# Patient Record
Sex: Male | Born: 1980 | Race: White | Hispanic: Yes | Marital: Single | Smoking: Never smoker
Health system: Southern US, Community
[De-identification: ages and names within clinical notes are randomized; demographics above are authoritative.]

## PROBLEM LIST (undated history)

## (undated) HISTORY — PX: NO PAST SURGERIES: SHX2092

---

## 2017-10-22 ENCOUNTER — Other Ambulatory Visit: Payer: Self-pay

## 2017-10-22 ENCOUNTER — Ambulatory Visit (INDEPENDENT_AMBULATORY_CARE_PROVIDER_SITE_OTHER): Payer: Self-pay

## 2017-10-22 ENCOUNTER — Ambulatory Visit
Admission: EM | Admit: 2017-10-22 | Discharge: 2017-10-22 | Disposition: A | Discharge status: Home / Self Care | Payer: Self-pay | Attending: Family Medicine | Admitting: Family Medicine

## 2017-10-22 DIAGNOSIS — R3129 Other microscopic hematuria: Secondary | ICD-10-CM

## 2017-10-22 DIAGNOSIS — R059 Cough, unspecified: Secondary | ICD-10-CM

## 2017-10-22 DIAGNOSIS — R05 Cough: Secondary | ICD-10-CM

## 2017-10-22 LAB — URINALYSIS, COMPLETE (UACMP) WITH MICROSCOPIC
BILIRUBIN URINE: NEGATIVE
Bacteria, UA: NONE SEEN
GLUCOSE, UA: NEGATIVE mg/dL
KETONES UR: NEGATIVE mg/dL
LEUKOCYTES UA: NEGATIVE
NITRITE: NEGATIVE
PH: 6.5 (ref 5.0–8.0)
Protein, ur: NEGATIVE mg/dL
Specific Gravity, Urine: 1.02 (ref 1.005–1.030)
Squamous Epithelial / LPF: NONE SEEN (ref 0–5)
WBC, UA: NONE SEEN WBC/hpf (ref 0–5)

## 2017-10-22 MED ORDER — TAMSULOSIN HCL 0.4 MG PO CAPS: 0.4000 mg | ORAL_CAPSULE | Freq: Every day | ORAL | 0 refills | Status: AC

## 2017-10-22 MED ORDER — BENZONATATE 100 MG PO CAPS: 100.0000 mg | ORAL_CAPSULE | Freq: Three times a day (TID) | ORAL | 0 refills | Status: AC | PRN

## 2017-10-22 NOTE — ED Triage Notes (Signed)
Also c/o dysuria, chest pain, headache.

## 2017-10-22 NOTE — ED Provider Notes (Signed)
[facility]    CSN: [id number] Arrival date & time: 10/22/17  1751  History   Chief Complaint Chief Complaint  Patient presents with  . Fever   HPI  37 year old male presents with multiple complaints.  Patient is Spanish-speaking.  Interpreter used today: [id number].   Patient reports a 2-week history of subjective fever.  He has felt hot.  He has not taken his temperature.  He is afebrile here.  He reports ongoing cough and associated chest discomfort.  Productive cough.  Additionally, patient reports pain with urination.  He is also had left low back pain/flank pain.  No hematuria.  No known exacerbating relieving factors.  No other associated symptoms.  No other concerns at this time.  History reviewed. No pertinent past medical history.  Past Surgical History:  Procedure Laterality Date  . NO PAST SURGERIES     Home Medications    Prior to Admission medications   Medication Sig Start Date End Date Taking? Authorizing Provider  benzonatate (TESSALON) 100 MG capsule Take 1 capsule (100 mg total) by mouth 3 (three) times daily as needed. 10/22/17   Tommie Sams, DO  tamsulosin (FLOMAX) 0.4 MG CAPS capsule Take 1 capsule (0.4 mg total) by mouth daily. 10/22/17   Tommie Sams, DO   Family History Family History  Problem Relation Age of Onset  . Healthy Mother   . Kidney disease Father    Social History Social History   Tobacco Use  . Smoking status: Never Smoker  . Smokeless tobacco: Never Used  Substance Use Topics  . Alcohol use: Yes    Comment: occasionally  . Drug use: Not Currently    Allergies   Patient has no known allergies.   Review of Systems Review of Systems  Constitutional: Positive for fever.  Respiratory: Positive for cough.   Cardiovascular: Positive for chest pain.  Genitourinary: Positive for dysuria.  Musculoskeletal: Positive for back pain.   Physical Exam Triage Vital Signs ED Triage Vitals  Enc Vitals Group     BP  10/22/17 1813 (!) 134/97     Pulse Rate 10/22/17 1813 96     Resp 10/22/17 1813 16     Temp 10/22/17 1813 97.9 F (36.6 C)     Temp Source 10/22/17 1813 Oral     SpO2 10/22/17 1813 99 %     Weight 10/22/17 1814 200 lb (90.7 kg)     Height 10/22/17 1814  (1.651 m)     Head Circumference --      Peak Flow --      Pain Score 10/22/17 1814 10     Pain Loc --      Pain Edu? --      Excl. in GC? --    Updated Vital Signs BP (!) 134/97 (BP Location: Left Arm)   Pulse 96   Temp 97.9 F (36.6 C) (Oral)   Resp 16   Ht  (1.651 m)   Wt 200 lb (90.7 kg)   SpO2 99%   BMI 33.28 kg/m     Physical Exam  Constitutional: He appears well-developed. No distress.  HENT:  Head: Normocephalic and atraumatic.  Cardiovascular: Normal rate and regular rhythm.  Pulmonary/Chest: Effort normal and breath sounds normal. He has no wheezes. He has no rales. He exhibits no tenderness.  Abdominal: Soft. He exhibits no distension. There is no tenderness.  No CVA tenderness.  Neurological: He is alert.  Psychiatric: He has a normal  mood and affect. His behavior is normal.  Nursing note and vitals reviewed.   UC Treatments / Results  Labs (all labs ordered are listed, but only abnormal results are displayed) Labs Reviewed  URINALYSIS, COMPLETE (UACMP) WITH MICROSCOPIC - Abnormal; Notable for the following components:      Result Value   APPearance HAZY (*)    Hgb urine dipstick MODERATE (*)    All other components within normal limits  URINE CULTURE    EKG None  Radiology Dg Abdomen 1 View  Result Date: 10/22/2017 CLINICAL DATA:  Left flank pain since 2 a.m. EXAM: ABDOMEN - 1 VIEW COMPARISON:  None. FINDINGS: No disproportionate dilatation of bowel. No obvious free intraperitoneal gas. Tiny round calcifications project over the pelvis bilaterally. IMPRESSION: Nonobstructive bowel gas pattern. Electronically Signed   By: Jolaine Click M.D.   On: 10/22/2017 19:27     Procedures Procedures (including critical care time)  Medications Ordered in UC Medications - No data to display  Initial Impression / Assessment and Plan / UC Course  I have reviewed the triage vital signs and the nursing notes.  Pertinent labs & imaging results that were available during my care of the patient were reviewed by me and considered in my medical decision making (see chart for details).     37 year old male presents with several complaints.  His exam is normal/unrevealing.  His urinalysis did reveal hematuria.  No pyuria.  Sending culture.  KUB was negative for stone.  Placing on Flomax.  Advised to see urology.    Regarding his cough and chest pain, no findings on exam.  No indication for imaging.  Treating cough with Tessalon Perles. Final Clinical Impressions(s) / UC Diagnoses   Final diagnoses:  Cough  Other microscopic hematuria    ED Prescriptions    Medication Sig Dispense Auth. Provider   tamsulosin (FLOMAX) 0.4 MG CAPS capsule Take 1 capsule (0.4 mg total) by mouth daily. 14 capsule Simran Mannis G, DO   benzonatate (TESSALON) 100 MG capsule Take 1 capsule (100 mg total) by mouth 3 (three) times daily as needed. 30 capsule Tommie Sams, DO     Controlled Substance Prescriptions [place] Controlled Substance Registry consulted? Not Applicable   Tommie Sams, DO 10/22/17 2011

## 2017-10-22 NOTE — Discharge Instructions (Signed)
You need to see urology given the blood in your urine.  Use the cough medication as needed.  Increase your fluid intake.  Take care  Dr. Adriana Simas

## 2017-10-24 LAB — URINE CULTURE: Culture: NO GROWTH

## 2019-07-12 IMAGING — CR DG ABDOMEN 1V
2 series · 2 of 2 positions shown · non-contrast
Comparison: None.

CLINICAL DATA: Left flank pain since 2 a.m.

EXAM:
ABDOMEN - 1 VIEW

[abdomen kub (1 of 2)]
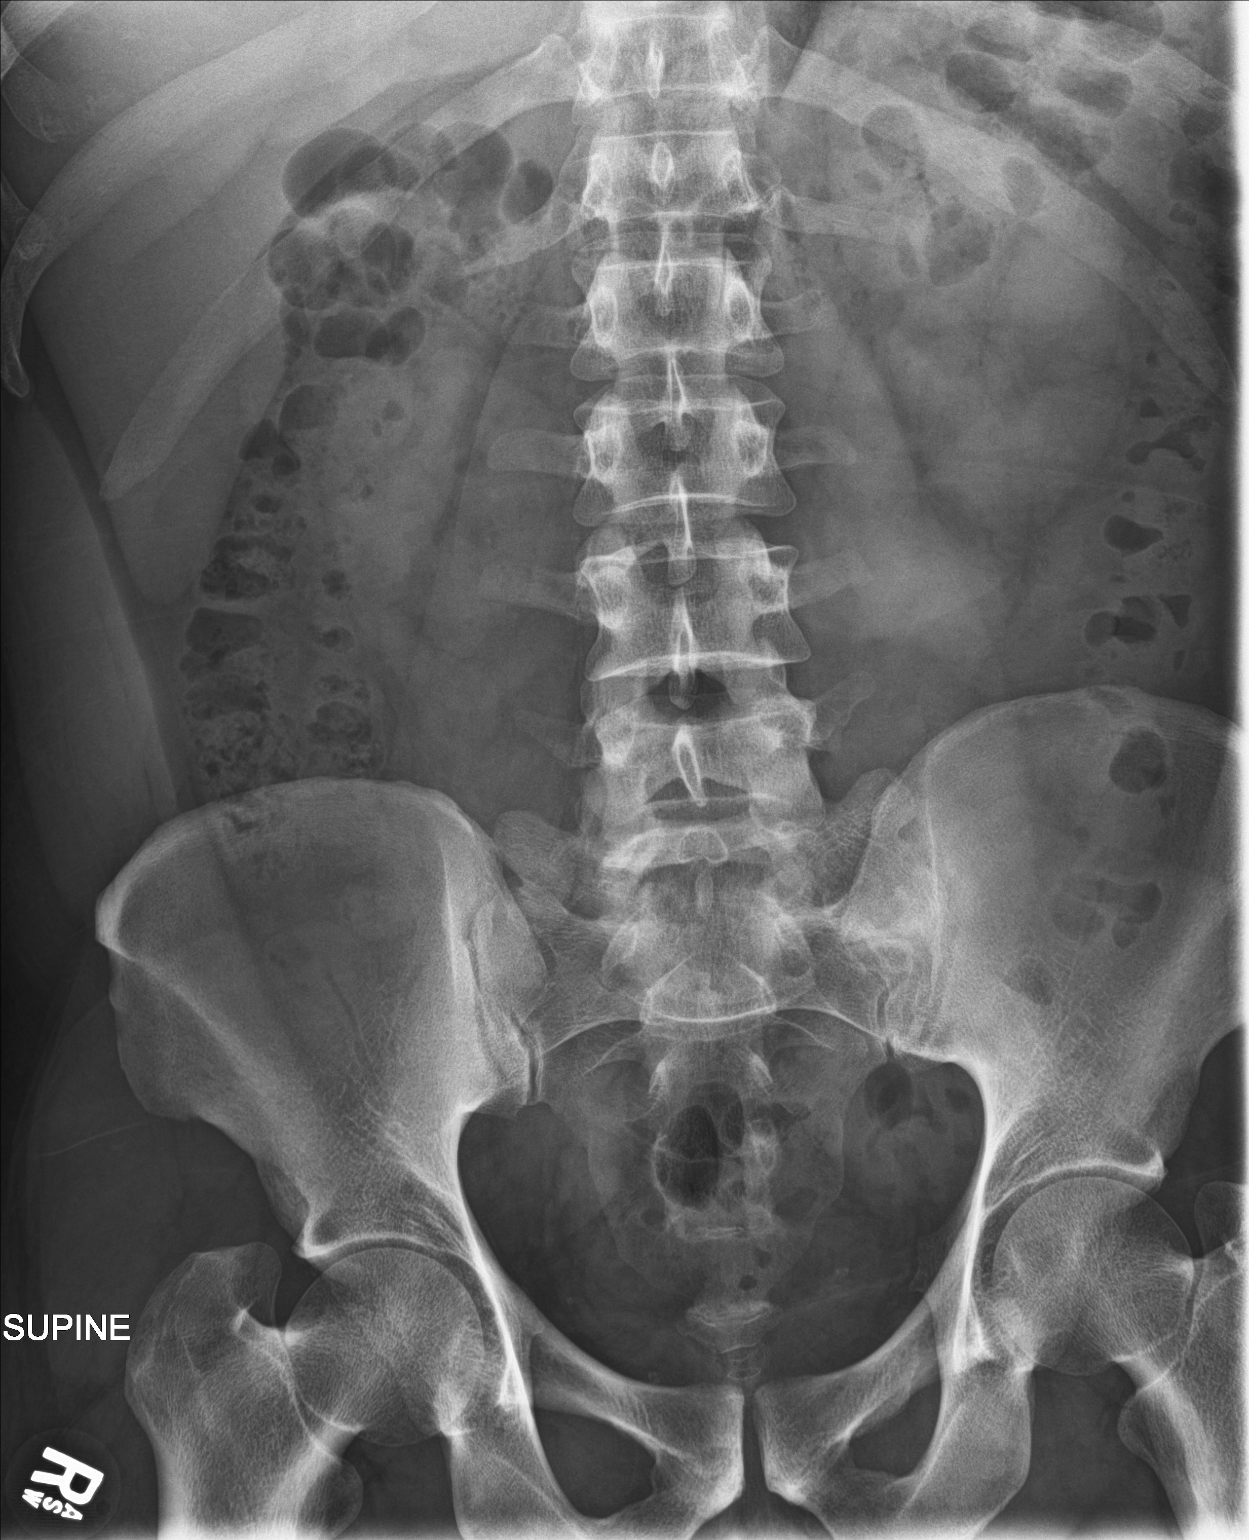

[abdomen kub (2 of 2)]
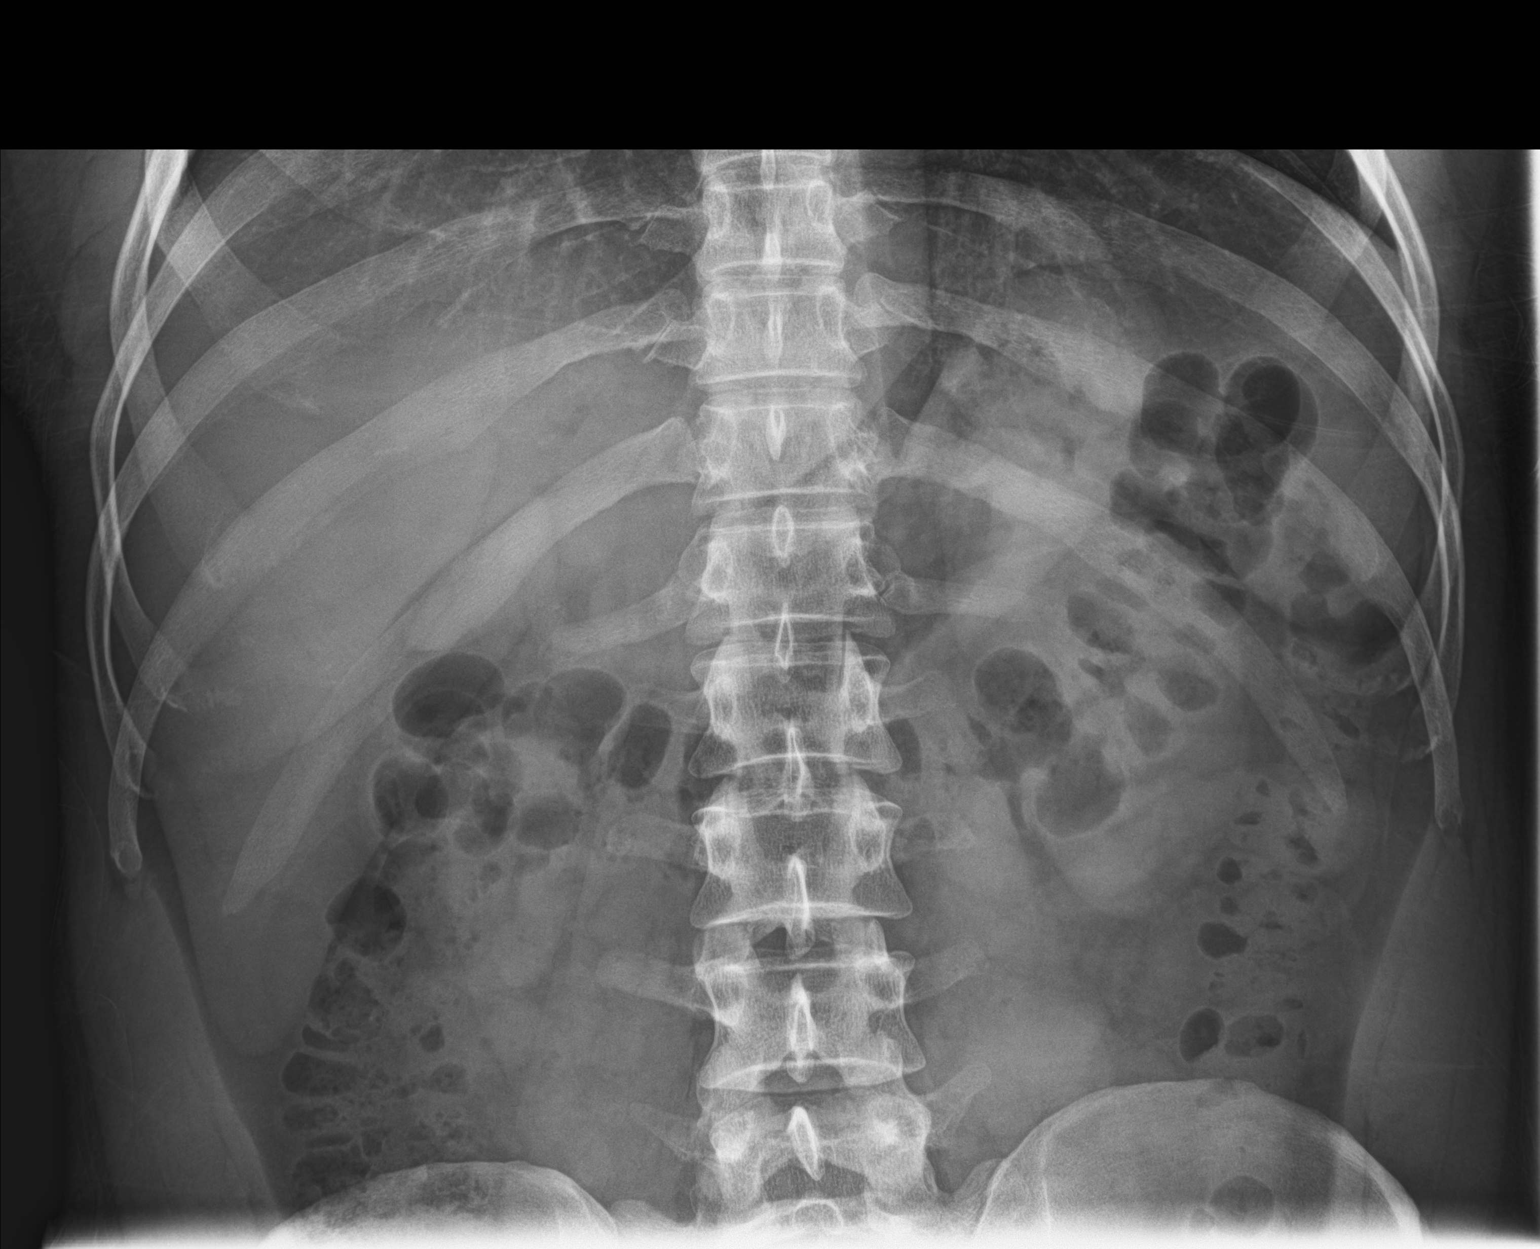

[2 of 2 positions shown; findings below may reference images not displayed]

FINDINGS: No disproportionate dilatation of bowel. No obvious free
intraperitoneal gas. Tiny round calcifications project over the
pelvis bilaterally.
IMPRESSION: Nonobstructive bowel gas pattern.
